# Patient Record
Sex: Male | Born: 1995 | Race: White | Hispanic: No | Marital: Single | Smoking: Never smoker
Health system: Southern US, Community
[De-identification: ages and names within clinical notes are randomized; demographics above are authoritative.]

## PROBLEM LIST (undated history)

## (undated) DIAGNOSIS — E039 Hypothyroidism, unspecified: Secondary | ICD-10-CM

## (undated) HISTORY — PX: OTHER SURGICAL HISTORY: SHX169

## (undated) HISTORY — DX: Hypothyroidism, unspecified: E03.9

---
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
## 2018-07-17 ENCOUNTER — Encounter: Payer: Self-pay | Admitting: Medical

## 2018-07-17 ENCOUNTER — Ambulatory Visit: Payer: BC Managed Care – PPO | Admitting: Medical

## 2018-07-17 VITALS — BP 114/70 | HR 62 | Temp 97.5°F | Resp 16 | Ht 69.0 in | Wt 163.0 lb

## 2018-07-17 DIAGNOSIS — E039 Hypothyroidism, unspecified: Secondary | ICD-10-CM

## 2018-07-17 DIAGNOSIS — L309 Dermatitis, unspecified: Secondary | ICD-10-CM | POA: Insufficient documentation

## 2018-07-17 NOTE — Progress Notes (Signed)
Subjective: Chief Complaint  Patient presents with  . np    get reestablished, hypothyroidism    Here for re-establish care, hypothyroidism.  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  He has no particular complaints.  He would like his blood test for thyroid as it is been a while since his last check  Hypothyroidism was diagnosed at birth.  He also has a maternal aunt and cousin with hypothyroidism  He has been on the 112 mcg dose for the past year  He recently started getting some irritation on both sides in his nasal folds that his girlfriend from [place] gave him a special cream from her homeland that has been helping  No other new complaint.    Objective: BP 114/70   Pulse 62   Temp (!) 97.5 F (36.4 C) (Oral)   Resp 16   Ht 5\' 9"  (1.753 m)   Wt 163 lb (73.9 kg)   SpO2 97%   BMI 24.07 kg/m   General appearance: alert, no distress, WD/WN,  Skin: slight erythema of bilat nasal folds, mild Neck: supple, no lymphadenopathy, no thyromegaly, no masses Heart: RRR, normal S1, S2, no murmurs Lungs: CTA bilaterally, no wheezes, rhonchi, or rales Pulses: 2+ symmetric, upper and lower extremities, normal cap refill    Assessment: Encounter Diagnoses  Name Primary?  . Hypothyroidism, unspecified type Yes  . Dermatitis      Plan: Hypothyroidism - c/t medication, labs today, discussed proper use of medication  dermatitis - mild, improving on OTC remedy  Advised physical for next visit with fasting labs   [place] was seen today for np.  Diagnoses and all orders for this visit:  Hypothyroidism, unspecified type -     TSH -     T4, free  Dermatitis

## 2018-07-18 LAB — TSH: TSH: 4.93 u[IU]/mL — ABNORMAL HIGH (ref 0.450–4.500)

## 2018-07-18 LAB — T4, FREE: Free T4: 1.33 ng/dL (ref 0.82–1.77)

## 2018-07-23 ENCOUNTER — Other Ambulatory Visit: Payer: Self-pay

## 2018-07-23 DIAGNOSIS — E039 Hypothyroidism, unspecified: Secondary | ICD-10-CM

## 2018-07-23 MED ORDER — LEVOTHYROXINE SODIUM 125 MCG PO TABS: 125.0000 ug | ORAL_TABLET | Freq: Every day | ORAL | 3 refills | Status: DC

## 2018-07-24 ENCOUNTER — Telehealth: Payer: Self-pay

## 2018-07-24 NOTE — Telephone Encounter (Signed)
LVM for pt to call back and make appt.  For a follow up on tsh . David Woodward

## 2019-02-03 ENCOUNTER — Other Ambulatory Visit: Payer: Self-pay

## 2019-02-03 ENCOUNTER — Telehealth: Payer: Self-pay

## 2019-02-03 DIAGNOSIS — Z20828 Contact with and (suspected) exposure to other viral communicable diseases: Secondary | ICD-10-CM

## 2019-02-03 DIAGNOSIS — R509 Fever, unspecified: Secondary | ICD-10-CM

## 2019-02-03 DIAGNOSIS — Z20822 Contact with and (suspected) exposure to covid-19: Secondary | ICD-10-CM

## 2019-02-03 NOTE — Telephone Encounter (Signed)
He has a 4-day history that started with myalgias, dizziness, weakness, fever, chills with nasal congestion and also decreased sense of smell and taste.  No cough or shortness of breath.  No history of COVID exposure.

## 2019-02-03 NOTE — Telephone Encounter (Signed)
Patient mom called and stated patient has COVID symptoms and need to be tested. Symptoms include loss of taste and smell, congestion, temp of 99.6, headache. Can order be placed.    Pt mom can reached [phone]

## 2019-02-05 ENCOUNTER — Telehealth: Payer: Self-pay

## 2019-02-05 ENCOUNTER — Telehealth: Payer: Self-pay | Admitting: Family Medicine

## 2019-02-05 NOTE — Telephone Encounter (Signed)
Pt mother called office to advise that [facility] as [facility]. Mom still does not have symptoms but will still need to be quarantined . Please advise due to test not being done at [facility] and mom says she has not got a call from [facility] to schedule appointment. Was PEC put in I remember order having to be changed to future Please advise [facility]

## 2019-02-05 NOTE — Telephone Encounter (Signed)
Mom was made aware. [employer]

## 2019-02-05 NOTE — Telephone Encounter (Signed)
Dr.Lalonde 

## 2019-02-05 NOTE — Telephone Encounter (Signed)
Advise mother that pt should stay hydrated and get rest. Treat fever with tylenol and stay quarantined for at least 14 days. Pt will need to be symptom free for three days with out any med. Pt mother was also advised that she should be tested as well in five days to rule out covid due to her not having any symptoms at this time.

## 2019-02-05 NOTE — Telephone Encounter (Signed)
If this test is positive then he needs to follow the protocol which would be fever free for 3 days without any antipyretics and 10 days from the start of his illness.  He should hear from the health department but to be safe make a note to call him next week and let him know that if he gets worse he would need to go to the hospital

## 2019-02-05 NOTE — Telephone Encounter (Signed)
Mom called and states the test we ordered, no one ever called them.  However, [facility].  So what should patient do, what should mom do?  Pt [phone]   Mom [phone]

## 2019-02-06 ENCOUNTER — Telehealth: Payer: Self-pay | Admitting: Internal Medicine

## 2019-02-06 NOTE — Telephone Encounter (Signed)
Per Dr. Redmond School, he wanted me to follow-up with pt and see how he was doing and see if anyone from health department has called since [facility]. Left message for pt to call me back

## 2019-02-12 ENCOUNTER — Telehealth: Payer: Self-pay

## 2019-02-12 NOTE — Telephone Encounter (Signed)
Patient called and says he [facility] and received a positive test result on 02/04/19. He says he's remained on isolation and today his symptoms are gone and he hasn't had any medication in 2 days. He says he was told to be on isolation and no medication for 48 hours. He's asking does he need to be retested to make sure the virus is gone. I asked did he contact his PCP, he says he has and no one has returned the call. I advised I will send this note to his PCP and if he doesn't hear back to call the office or send a MyChart message.

## 2019-02-13 NOTE — Telephone Encounter (Signed)
I called and left a message concerning the 72 hours fever free and asymptomatic for 10 days and the fact that he does not need to be retested.  Told to call back if he had questions.

## 2019-02-17 ENCOUNTER — Telehealth: Payer: Self-pay | Admitting: Family Medicine

## 2019-02-17 NOTE — Telephone Encounter (Signed)
Pt was returning your call, he apparently is having phone issues he Did not get your voicemail until last night  He is feeling bette No fever, no meds last 5 days  He did have a question about donating plasma He saw something about covid patients donating plasma and is interested in doing that but not sure where to go or who to contact

## 2019-02-18 NOTE — Telephone Encounter (Signed)
LVM for pt to advise of Dr. Redmond School message . [place]

## 2019-02-18 NOTE — Telephone Encounter (Signed)
Called pt again to advise of message from Dr. Redmond School. [place]

## 2019-02-18 NOTE — Telephone Encounter (Signed)
[facility] on [address] is a place to go

## 2019-02-19 NOTE — Telephone Encounter (Signed)
Sent a my chart message due to no answer. David Woodward

## 2019-04-07 ENCOUNTER — Telehealth: Payer: Self-pay | Admitting: Family Medicine

## 2019-04-07 NOTE — Telephone Encounter (Signed)
PT called and wanted to know if he can get labs drawn to check his thyroid levels

## 2019-04-07 NOTE — Telephone Encounter (Signed)
LVM to advise pt that he was alst checked in Dec. And if he would like to have redone we will be happy to see him. [place]

## 2019-04-07 NOTE — Telephone Encounter (Signed)
We normally check these on a yearly basis but if he wants to come in early, schedule him.  It was checked in December of last year so we can do it early.

## 2019-04-09 ENCOUNTER — Other Ambulatory Visit: Payer: BC Managed Care – PPO

## 2019-04-09 ENCOUNTER — Other Ambulatory Visit: Payer: Self-pay

## 2019-04-09 DIAGNOSIS — E039 Hypothyroidism, unspecified: Secondary | ICD-10-CM

## 2019-04-10 LAB — TSH: TSH: 3.15 u[IU]/mL (ref 0.450–4.500)

## 2019-06-02 ENCOUNTER — Other Ambulatory Visit: Payer: Self-pay | Admitting: Medical

## 2019-06-02 ENCOUNTER — Telehealth: Payer: Self-pay | Admitting: Medical

## 2019-06-02 DIAGNOSIS — E039 Hypothyroidism, unspecified: Secondary | ICD-10-CM

## 2019-06-02 MED ORDER — LEVOTHYROXINE SODIUM 125 MCG PO TABS: 125.0000 ug | ORAL_TABLET | Freq: Every day | ORAL | 0 refills | Status: DC

## 2019-06-02 NOTE — Telephone Encounter (Signed)
Called and left pt a voicemail.

## 2019-06-02 NOTE — Telephone Encounter (Signed)
Please call him  I recently got an email message.  I sent his Synthroid to [facility] in [facility].  I am assuming that is the place he was talking about.  He needs to go ahead and be placed on the schedule for physical in December, fasting

## 2019-08-13 ENCOUNTER — Other Ambulatory Visit: Payer: Self-pay | Admitting: Medical

## 2019-08-13 DIAGNOSIS — E039 Hypothyroidism, unspecified: Secondary | ICD-10-CM

## 2019-09-16 ENCOUNTER — Telehealth: Payer: Self-pay | Admitting: Family Medicine

## 2019-09-16 NOTE — Telephone Encounter (Signed)
Called pt dermatology office [phone]) to get last office notes and lab order faxed over to our office. Once we get this info pt will be called to scheduled . [facility]

## 2019-09-16 NOTE — Telephone Encounter (Signed)
Need a note from the dermatologist and also, blood work they want

## 2019-09-16 NOTE — Telephone Encounter (Signed)
Pts mother called and said pts dermatologist is requesting he have blood work done to check for accutane. Wasn't sure if orders needed to be put in

## 2019-09-17 ENCOUNTER — Other Ambulatory Visit: Payer: Self-pay

## 2019-09-17 DIAGNOSIS — L700 Acne vulgaris: Secondary | ICD-10-CM

## 2019-09-18 NOTE — Telephone Encounter (Signed)
LVM for pt to call and schedule a nurse visit for lab.  Order has been put in already. Please advise of appt so form can be placed in chart. [facility] 09-18-19

## 2019-09-30 ENCOUNTER — Other Ambulatory Visit: Payer: Self-pay

## 2019-09-30 ENCOUNTER — Other Ambulatory Visit: Payer: BC Managed Care – PPO

## 2019-09-30 DIAGNOSIS — L700 Acne vulgaris: Secondary | ICD-10-CM

## 2019-10-01 LAB — TRIGLYCERIDES: Triglycerides: 45 mg/dL (ref 0–149)

## 2019-10-01 LAB — ALT: ALT: 18 IU/L (ref 0–44)

## 2019-10-01 LAB — CK TOTAL AND CKMB (NOT AT ARMC)
CK-MB Index: 1.7 ng/mL (ref 0.0–10.4)
Total CK: 264 U/L (ref 49–439)

## 2019-10-01 LAB — AST: AST: 23 IU/L (ref 0–40)

## 2019-10-31 ENCOUNTER — Telehealth: Payer: Self-pay

## 2019-10-31 NOTE — Telephone Encounter (Signed)
Pt. Called stating that his Dermatologists never got the lab results that he ordered that was done here on 09/30/19, so I did refax those, he also mentioned that they are needing new blood work because they are increasing his medication there. I did schedule him for a lab visit next Wednesday and I told him his Dermatology office would need to fax an order for that over before Wednesday.

## 2019-11-05 ENCOUNTER — Other Ambulatory Visit: Payer: BC Managed Care – PPO

## 2020-01-10 ENCOUNTER — Other Ambulatory Visit: Payer: Self-pay | Admitting: Medical

## 2020-01-10 DIAGNOSIS — E039 Hypothyroidism, unspecified: Secondary | ICD-10-CM

## 2020-01-12 NOTE — Telephone Encounter (Signed)
I do not believe he has been in for the last 12 months.  Please schedule physical appointment or med check follow-up

## 2020-01-12 NOTE — Telephone Encounter (Signed)
Is this appropriate?  

## 2020-04-08 ENCOUNTER — Other Ambulatory Visit: Payer: Self-pay

## 2020-04-08 DIAGNOSIS — E039 Hypothyroidism, unspecified: Secondary | ICD-10-CM

## 2020-04-08 MED ORDER — LEVOTHYROXINE SODIUM 125 MCG PO TABS: 125.0000 ug | ORAL_TABLET | Freq: Every day | ORAL | 0 refills | Status: DC

## 2020-04-08 NOTE — Telephone Encounter (Signed)
Sent message to patient that he needs an appt and refilled for 30 days

## 2020-04-13 ENCOUNTER — Ambulatory Visit: Payer: BC Managed Care – PPO | Admitting: Medical

## 2020-04-21 ENCOUNTER — Ambulatory Visit: Payer: BC Managed Care – PPO | Admitting: Medical

## 2020-04-22 ENCOUNTER — Encounter: Payer: Self-pay | Admitting: Family Medicine

## 2020-05-03 ENCOUNTER — Other Ambulatory Visit: Payer: Self-pay | Admitting: Family Medicine

## 2020-05-03 DIAGNOSIS — E039 Hypothyroidism, unspecified: Secondary | ICD-10-CM

## 2020-05-18 ENCOUNTER — Encounter: Payer: BC Managed Care – PPO | Admitting: Medical

## 2020-05-18 ENCOUNTER — Telehealth: Payer: Self-pay | Admitting: Medical

## 2020-05-18 DIAGNOSIS — Z0000 Encounter for general adult medical examination without abnormal findings: Secondary | ICD-10-CM

## 2020-05-18 NOTE — Telephone Encounter (Signed)
Patient will be sent no show letter and fee which will need to be paid before rescheduling appointment.

## 2020-05-18 NOTE — Telephone Encounter (Signed)
This patient no showed for their appointment today.Which of the following is necessary for this patient.   A) No follow-up necessary   B) Follow-up urgent. Locate Patient Immediately.   C) Follow-up necessary. Contact patient and Schedule visit in ____ Days.   D) Follow-up Advised. Contact patient and Schedule visit in ____ Days.   E) Please Send no show letter to patient. Charge no show fee if no show was a CPE.   He is actually due for physical

## 2020-05-19 ENCOUNTER — Other Ambulatory Visit: Payer: Self-pay

## 2020-05-19 ENCOUNTER — Telehealth: Payer: Self-pay | Admitting: Family Medicine

## 2020-05-19 ENCOUNTER — Encounter: Payer: Self-pay | Admitting: Family Medicine

## 2020-05-19 DIAGNOSIS — E039 Hypothyroidism, unspecified: Secondary | ICD-10-CM

## 2020-05-19 MED ORDER — LEVOTHYROXINE SODIUM 125 MCG PO TABS: 125.0000 ug | ORAL_TABLET | Freq: Every day | ORAL | 0 refills | Status: DC

## 2020-05-19 NOTE — Telephone Encounter (Signed)
Pt called and is requesting a refill on his synthroid pt scheduled a cpe next Tuesday [facility]  [address] [place], [place], [place] [address]

## 2020-05-19 NOTE — Telephone Encounter (Signed)
Refill has been sent per patient request.

## 2020-05-19 NOTE — Telephone Encounter (Signed)
Please add fee to pt account  I sent letter to pt

## 2020-05-25 ENCOUNTER — Ambulatory Visit (INDEPENDENT_AMBULATORY_CARE_PROVIDER_SITE_OTHER): Payer: BC Managed Care – PPO | Admitting: Medical

## 2020-05-25 ENCOUNTER — Encounter: Payer: Self-pay | Admitting: Medical

## 2020-05-25 ENCOUNTER — Other Ambulatory Visit: Payer: Self-pay

## 2020-05-25 VITALS — BP 102/70 | HR 75 | Ht 69.0 in | Wt 178.0 lb

## 2020-05-25 DIAGNOSIS — Z23 Encounter for immunization: Secondary | ICD-10-CM | POA: Diagnosis not present

## 2020-05-25 DIAGNOSIS — Z7185 Encounter for immunization safety counseling: Secondary | ICD-10-CM

## 2020-05-25 DIAGNOSIS — L989 Disorder of the skin and subcutaneous tissue, unspecified: Secondary | ICD-10-CM | POA: Insufficient documentation

## 2020-05-25 DIAGNOSIS — Z13220 Encounter for screening for lipoid disorders: Secondary | ICD-10-CM

## 2020-05-25 DIAGNOSIS — Z113 Encounter for screening for infections with a predominantly sexual mode of transmission: Secondary | ICD-10-CM | POA: Diagnosis not present

## 2020-05-25 DIAGNOSIS — E031 Congenital hypothyroidism without goiter: Secondary | ICD-10-CM | POA: Diagnosis not present

## 2020-05-25 DIAGNOSIS — Z0000 Encounter for general adult medical examination without abnormal findings: Secondary | ICD-10-CM | POA: Diagnosis not present

## 2020-05-25 LAB — LIPID PANEL

## 2020-05-25 MED ORDER — TRETINOIN 0.05 % EX CREA: TOPICAL_CREAM | Freq: Every day | CUTANEOUS | 0 refills | Status: AC

## 2020-05-25 NOTE — Progress Notes (Signed)
Subjective:   HPI  David Woodward is a 24 y.o. male who presents for Chief Complaint  Patient presents with  . Annual Exam    not fasting     Patient Care Team: David Nian, MD as PCP - General (Family Medicine) Sees dentist: Dr. Arvilla Woodward eye doctor:n/a   Concerns: Patient has no concerns today.  He has congenital hypothyroidism, uses name brand Synthroid  He is very active, is a Chief of Staff.  Reviewed their medical, surgical, family, social, medication, and allergy history and updated chart as appropriate.  Past Medical History:  Diagnosis Date  . Hypothyroidism    congenital    Past Surgical History:  Procedure Laterality Date  . undescended  teste     age 54    Family History  Problem Relation Age of Onset  . Cancer Maternal Grandmother        breast  . Cancer Paternal Grandfather        lung  . Heart disease Neg Hx   . Diabetes Neg Hx   . Stroke Neg Hx   . Hypertension Neg Hx      Current Outpatient Medications:  .  levothyroxine (SYNTHROID) 125 MCG tablet, Take 1 tablet (125 mcg total) by mouth daily., Disp: 30 tablet, Rfl: 0 .  tretinoin (RETIN-A) 0.05 % cream, Apply topically at bedtime., Disp: 20 g, Rfl: 0  No Known Allergies     Review of Systems Constitutional: -fever, -chills, -sweats, -unexpected weight change, -decreased appetite, -fatigue Allergy: -sneezing, -itching, -congestion Dermatology: -changing moles, --rash, -lumps ENT: -runny nose, -ear pain, -sore throat, -hoarseness, -sinus pain, -teeth pain, - ringing in ears, -hearing loss, -nosebleeds Cardiology: -chest pain, -palpitations, -swelling, -difficulty breathing when lying flat, -waking up short of breath Respiratory: -cough, -shortness of breath, -difficulty breathing with exercise or exertion, -wheezing, -coughing up blood Gastroenterology: -abdominal pain, -nausea, -vomiting, -diarrhea, -constipation, -blood in stool, -changes in bowel movement, -difficulty  swallowing or eating Hematology: -bleeding, -bruising  Musculoskeletal: -joint aches, -muscle aches, -joint swelling, -back pain, -neck pain, -cramping, -changes in gait Ophthalmology: denies vision changes, eye redness, itching, discharge Urology: -burning with urination, -difficulty urinating, -blood in urine, -urinary frequency, -urgency, -incontinence Neurology: -headache, -weakness, -tingling, -numbness, -memory loss, -falls, -dizziness Psychology: -depressed mood, -agitation, -sleep problems Male GU: no testicular mass, pain, no lymph nodes swollen, no swelling, no rash.     Objective:  BP 102/70   Pulse 75   Ht 5\' 9"  (1.753 m)   Wt 178 lb (80.7 kg)   SpO2 97%   BMI 26.29 kg/m   General appearance: alert, no distress, WD/WN, Caucasian male Skin: Suprapubic region midline with a small 3 mm somewhat brownish-red lesion nonspecific, otherwise no other lesion HEENT: normocephalic, conjunctiva/corneas normal, sclerae anicteric, PERRLA, EOMi, nares patent, no discharge or erythema, pharynx normal Oral cavity: MMM, tongue normal, teeth normal Neck: supple, no lymphadenopathy, no thyromegaly, no masses, normal ROM, no bruits Chest: non tender, normal shape and expansion Heart: RRR, normal S1, S2, no murmurs Lungs: CTA bilaterally, no wheezes, rhonchi, or rales Abdomen: +bs, soft, non tender, non distended, no masses, no hepatomegaly, no splenomegaly, no bruits Back: non tender, normal ROM, no scoliosis Musculoskeletal: upper extremities non tender, no obvious deformity, normal ROM throughout, lower extremities non tender, no obvious deformity, normal ROM throughout Extremities: no edema, no cyanosis, no clubbing Pulses: 2+ symmetric, upper and lower extremities, normal cap refill Neurological: alert, oriented x 3, CN2-12 intact, strength normal upper extremities and lower extremities, sensation  normal throughout, DTRs 2+ throughout, no cerebellar signs, gait normal Psychiatric: normal  affect, behavior normal, pleasant  GU: normal male external genitalia,circumcised, nontender, no masses, no hernia, right inguinal surgical scar ,no lymphadenopathy Rectal: Deferred   Assessment and Plan :   Encounter Diagnoses  Name Primary?  . Encounter for health maintenance examination in adult Yes  . Congenital hypothyroidism without goiter   . Screening for lipid disorders   . Screen for STD (sexually transmitted disease)   . Vaccine counseling   . Skin lesion   . Need for Tdap vaccination   . Need for HPV vaccination     Physical exam - discussed and counseled on healthy lifestyle, diet, exercise, preventative care, vaccinations, sick and well care, proper use of emergency dept and after hours care, and addressed their concerns.    Health screening: See your eye doctor yearly for routine vision care. See your dentist yearly for routine dental care including hygiene visits twice yearly.  Discussed STD testing, discussed prevention, condom use, means of transmission  Cancer screening Advised monthly self testicular exam  Vaccinations: Advised yearly influenza vaccine. Patient declines flu shot today.   Counseled on the Tdap (tetanus, diptheria, and acellular pertussis) vaccine.  Vaccine information sheet given. Tdap vaccine given after consent obtained.  Counseled on the Human Papilloma virus vaccine.  Vaccine information sheet given.  HPV vaccine given after consent obtained.    Counseled on Covid vaccine.  He will consider    Separate significant issues discussed: Hypothyroidism-labs today, continue name brand Synthroid, discussed proper use of medication  Skin lesion suprapubic region -begin medication below.  If not much improved in 7 to 10 days, consider change to triamcinolone cream.   David Woodward was seen today for annual exam.  Diagnoses and all orders for this visit:  Encounter for health maintenance examination in adult -     Lipid panel -      Comprehensive metabolic panel -     CBC -     TSH -     T4, Free -     HIV Antibody (routine testing w rflx) -     RPR -     GC/Chlamydia Probe Amp  Congenital hypothyroidism without goiter -     TSH -     T4, Free  Screening for lipid disorders -     Lipid panel  Screen for STD (sexually transmitted disease) -     HIV Antibody (routine testing w rflx) -     RPR -     GC/Chlamydia Probe Amp  Vaccine counseling  Skin lesion  Need for Tdap vaccination  Need for HPV vaccination  Other orders -     Tdap vaccine greater than or equal to 7yo IM -     HPV 9-valent vaccine,Recombinat -     tretinoin (RETIN-A) 0.05 % cream; Apply topically at bedtime.    Follow-up pending labs, yearly for physical

## 2020-05-26 ENCOUNTER — Other Ambulatory Visit: Payer: Self-pay | Admitting: Medical

## 2020-05-26 DIAGNOSIS — E039 Hypothyroidism, unspecified: Secondary | ICD-10-CM

## 2020-05-26 LAB — COMPREHENSIVE METABOLIC PANEL
ALT: 15 IU/L (ref 0–44)
AST: 26 IU/L (ref 0–40)
Albumin/Globulin Ratio: 2 (ref 1.2–2.2)
Albumin: 4.9 g/dL (ref 4.1–5.2)
Alkaline Phosphatase: 86 IU/L (ref 44–121)
BUN/Creatinine Ratio: 14 (ref 9–20)
BUN: 15 mg/dL (ref 6–20)
Bilirubin Total: 0.5 mg/dL (ref 0.0–1.2)
CO2: 28 mmol/L (ref 20–29)
Calcium: 9.5 mg/dL (ref 8.7–10.2)
Chloride: 99 mmol/L (ref 96–106)
Creatinine, Ser: 1.06 mg/dL (ref 0.76–1.27)
GFR calc Af Amer: 113 mL/min/{1.73_m2} (ref >59)
GFR calc non Af Amer: 98 mL/min/{1.73_m2} (ref >59)
Globulin, Total: 2.4 g/dL (ref 1.5–4.5)
Glucose: 91 mg/dL (ref 65–99)
Potassium: 4.4 mmol/L (ref 3.5–5.2)
Sodium: 139 mmol/L (ref 134–144)
Total Protein: 7.3 g/dL (ref 6.0–8.5)

## 2020-05-26 LAB — CBC
Hematocrit: 49.2 % (ref 37.5–51.0)
Hemoglobin: 17 g/dL (ref 13.0–17.7)
MCH: 32.2 pg (ref 26.6–33.0)
MCHC: 34.6 g/dL (ref 31.5–35.7)
MCV: 93 fL (ref 79–97)
Platelets: 266 10*3/uL (ref 150–450)
RBC: 5.28 x10E6/uL (ref 4.14–5.80)
RDW: 12.7 % (ref 11.6–15.4)
WBC: 4.3 10*3/uL (ref 3.4–10.8)

## 2020-05-26 LAB — HIV ANTIBODY (ROUTINE TESTING W REFLEX): HIV Screen 4th Generation wRfx: NONREACTIVE

## 2020-05-26 LAB — LIPID PANEL
Chol/HDL Ratio: 4.1 ratio (ref 0.0–5.0)
Cholesterol, Total: 175 mg/dL (ref 100–199)
HDL: 43 mg/dL (ref >39)
LDL Chol Calc (NIH): 102 mg/dL — ABNORMAL HIGH (ref 0–99)
Triglycerides: 169 mg/dL — ABNORMAL HIGH (ref 0–149)
VLDL Cholesterol Cal: 30 mg/dL (ref 5–40)

## 2020-05-26 LAB — T4, FREE: Free T4: 1.35 ng/dL (ref 0.82–1.77)

## 2020-05-26 LAB — TSH: TSH: 6.02 u[IU]/mL — ABNORMAL HIGH (ref 0.450–4.500)

## 2020-05-26 LAB — GC/CHLAMYDIA PROBE AMP
Chlamydia trachomatis, NAA: NEGATIVE
Neisseria Gonorrhoeae by PCR: NEGATIVE

## 2020-05-26 LAB — RPR: RPR Ser Ql: NONREACTIVE

## 2020-05-26 MED ORDER — LEVOTHYROXINE SODIUM 125 MCG PO TABS: 125.0000 ug | ORAL_TABLET | Freq: Every day | ORAL | 3 refills | Status: DC

## 2020-06-16 ENCOUNTER — Other Ambulatory Visit: Payer: Self-pay | Admitting: Medical

## 2020-06-16 DIAGNOSIS — E039 Hypothyroidism, unspecified: Secondary | ICD-10-CM

## 2021-05-26 ENCOUNTER — Telehealth: Payer: Self-pay | Admitting: Medical

## 2021-05-26 ENCOUNTER — Encounter: Payer: BC Managed Care – PPO | Admitting: Medical

## 2021-05-26 DIAGNOSIS — Z0000 Encounter for general adult medical examination without abnormal findings: Secondary | ICD-10-CM

## 2021-05-26 NOTE — Telephone Encounter (Signed)
This patient no showed for their appointment today.Which of the following is necessary for this patient.   A) No follow-up necessary   B) Follow-up urgent. Locate Patient Immediately.   C) Follow-up necessary. Contact patient and Schedule visit in ____ Days.   D) Follow-up Advised. Contact patient and Schedule visit in ____ Days.   E) Please Send no show letter to patient. Charge no show fee  

## 2021-06-02 ENCOUNTER — Encounter: Payer: Self-pay | Admitting: Family Medicine

## 2021-12-22 ENCOUNTER — Ambulatory Visit: Payer: Self-pay | Admitting: Medical

## 2022-02-17 ENCOUNTER — Telehealth: Payer: Self-pay

## 2022-02-17 ENCOUNTER — Other Ambulatory Visit: Payer: Self-pay | Admitting: Medical

## 2022-02-17 DIAGNOSIS — E039 Hypothyroidism, unspecified: Secondary | ICD-10-CM

## 2022-02-17 MED ORDER — LEVOTHYROXINE SODIUM 125 MCG PO TABS: 125.0000 ug | ORAL_TABLET | Freq: Every day | ORAL | 0 refills | Status: DC

## 2022-02-17 MED ORDER — LEVOTHYROXINE SODIUM 125 MCG PO TABS: 125.0000 ug | ORAL_TABLET | Freq: Every day | ORAL | 0 refills | Status: AC

## 2022-02-17 NOTE — Telephone Encounter (Signed)
Sent in to [facility] per pt

## 2022-02-17 NOTE — Telephone Encounter (Signed)
Pt. Called stating he used to be a pt. Of yours but when he turned 25 he had to get a new ins. Company. His new ins. Was out of network at our office so he had to find a new PCP. It took him a while to find a new PCP that took his current ins. And they can't get him in until another month from now. He wanted to know if there was anyway you could give him a 30 day refill on his thyroid medication until he can get into his new doctor's office.

## 2022-03-12 ENCOUNTER — Other Ambulatory Visit: Payer: Self-pay | Admitting: Medical

## 2022-03-12 DIAGNOSIS — E039 Hypothyroidism, unspecified: Secondary | ICD-10-CM

## 2022-04-12 ENCOUNTER — Encounter: Payer: Self-pay | Admitting: Internal Medicine

## 2022-05-16 ENCOUNTER — Encounter: Payer: Self-pay | Admitting: Internal Medicine

## 2022-05-29 ENCOUNTER — Encounter: Payer: Self-pay | Admitting: Internal Medicine

## 2023-10-02 ENCOUNTER — Encounter: Payer: Self-pay | Admitting: Internal Medicine
